# Patient Record
Sex: Male | Born: 1980 | Race: White | Hispanic: No | Marital: Married | Smoking: Never smoker
Health system: Southern US, Community
[De-identification: ages and names within clinical notes are randomized; demographics above are authoritative.]

## PROBLEM LIST (undated history)

## (undated) DIAGNOSIS — I10 Essential (primary) hypertension: Secondary | ICD-10-CM

## (undated) DIAGNOSIS — J45909 Unspecified asthma, uncomplicated: Secondary | ICD-10-CM

## (undated) DIAGNOSIS — T7840XA Allergy, unspecified, initial encounter: Secondary | ICD-10-CM

## (undated) DIAGNOSIS — N50819 Testicular pain, unspecified: Secondary | ICD-10-CM

## (undated) HISTORY — DX: Allergy, unspecified, initial encounter: T78.40XA

## (undated) HISTORY — DX: Testicular pain, unspecified: N50.819

## (undated) HISTORY — DX: Unspecified asthma, uncomplicated: J45.909

---

## 2004-02-05 ENCOUNTER — Emergency Department (HOSPITAL_COMMUNITY): Admission: EM | Admit: 2004-02-05 | Discharge: 2004-02-05 | Payer: Self-pay | Admitting: Family Medicine

## 2005-05-12 HISTORY — PX: PILONIDAL CYST EXCISION: SHX744

## 2005-06-13 ENCOUNTER — Ambulatory Visit (HOSPITAL_COMMUNITY): Admission: RE | Admit: 2005-06-13 | Discharge: 2005-06-13 | Payer: Self-pay | Admitting: Surgery

## 2008-11-28 LAB — LIPID PANEL
Cholesterol: 164 mg/dL (ref 0–200)
HDL: 49 mg/dL (ref 35–70)
LDL Cholesterol: 116 mg/dL
LDl/HDL Ratio: 3.4
Triglycerides: 69 mg/dL (ref 40–160)

## 2008-11-28 LAB — BASIC METABOLIC PANEL
BUN: 12 mg/dL (ref 4–21)
Creatinine: 1.2 mg/dL (ref 0.6–1.3)
GLUCOSE: 75 mg/dL
POTASSIUM: 5.3 mmol/L (ref 3.4–5.3)
Sodium: 144 mmol/L (ref 137–147)

## 2008-11-28 LAB — HEPATIC FUNCTION PANEL
ALT: 24 U/L (ref 10–40)
AST: 28 U/L (ref 14–40)
Bilirubin, Total: 1.8 mg/dL

## 2008-11-29 LAB — CBC AND DIFFERENTIAL
HCT: 45 % (ref 41–53)
Hemoglobin: 15.7 g/dL (ref 13.5–17.5)
PLATELETS: 253 10*3/uL (ref 150–399)
WBC: 7.9 10^3/mL

## 2011-09-19 ENCOUNTER — Other Ambulatory Visit (HOSPITAL_COMMUNITY): Payer: Self-pay | Admitting: Urology

## 2011-09-19 DIAGNOSIS — M545 Low back pain: Secondary | ICD-10-CM

## 2011-10-22 ENCOUNTER — Ambulatory Visit (HOSPITAL_COMMUNITY)
Admission: RE | Admit: 2011-10-22 | Discharge: 2011-10-22 | Disposition: A | Discharge status: Home / Self Care | Payer: BC Managed Care – PPO | Source: Ambulatory Visit | Attending: Urology | Admitting: Urology

## 2011-10-22 ENCOUNTER — Other Ambulatory Visit (HOSPITAL_COMMUNITY): Payer: Self-pay | Admitting: Urology

## 2011-10-22 ENCOUNTER — Inpatient Hospital Stay (HOSPITAL_COMMUNITY): Admission: RE | Admit: 2011-10-22 | Payer: Self-pay | Source: Ambulatory Visit

## 2011-10-22 DIAGNOSIS — M5126 Other intervertebral disc displacement, lumbar region: Secondary | ICD-10-CM | POA: Insufficient documentation

## 2011-10-22 DIAGNOSIS — R209 Unspecified disturbances of skin sensation: Secondary | ICD-10-CM | POA: Insufficient documentation

## 2011-10-22 DIAGNOSIS — M79609 Pain in unspecified limb: Secondary | ICD-10-CM | POA: Insufficient documentation

## 2011-10-22 DIAGNOSIS — R109 Unspecified abdominal pain: Secondary | ICD-10-CM | POA: Insufficient documentation

## 2011-10-22 DIAGNOSIS — M545 Low back pain, unspecified: Secondary | ICD-10-CM | POA: Insufficient documentation

## 2011-10-22 DIAGNOSIS — N509 Disorder of male genital organs, unspecified: Secondary | ICD-10-CM | POA: Insufficient documentation

## 2011-11-17 ENCOUNTER — Ambulatory Visit: Payer: BC Managed Care – PPO | Attending: Diagnostic Neuroimaging

## 2011-11-17 DIAGNOSIS — M545 Low back pain, unspecified: Secondary | ICD-10-CM | POA: Insufficient documentation

## 2011-11-17 DIAGNOSIS — M25569 Pain in unspecified knee: Secondary | ICD-10-CM | POA: Insufficient documentation

## 2011-11-17 DIAGNOSIS — M6281 Muscle weakness (generalized): Secondary | ICD-10-CM | POA: Insufficient documentation

## 2011-11-17 DIAGNOSIS — IMO0001 Reserved for inherently not codable concepts without codable children: Secondary | ICD-10-CM | POA: Insufficient documentation

## 2011-11-21 ENCOUNTER — Ambulatory Visit: Payer: BC Managed Care – PPO | Admitting: Physical Therapy

## 2011-11-25 ENCOUNTER — Ambulatory Visit: Payer: BC Managed Care – PPO

## 2011-11-28 ENCOUNTER — Ambulatory Visit: Payer: BC Managed Care – PPO | Admitting: Physical Therapy

## 2011-12-02 ENCOUNTER — Ambulatory Visit: Payer: BC Managed Care – PPO

## 2011-12-05 ENCOUNTER — Encounter: Payer: BC Managed Care – PPO | Admitting: Physical Therapy

## 2011-12-09 ENCOUNTER — Encounter: Payer: BC Managed Care – PPO | Admitting: Physical Therapy

## 2011-12-12 ENCOUNTER — Encounter: Payer: BC Managed Care – PPO | Admitting: Physical Therapy

## 2014-06-06 ENCOUNTER — Encounter: Payer: Self-pay | Admitting: Family Medicine

## 2014-06-06 DIAGNOSIS — T7840XA Allergy, unspecified, initial encounter: Secondary | ICD-10-CM | POA: Insufficient documentation

## 2014-06-06 DIAGNOSIS — J45909 Unspecified asthma, uncomplicated: Secondary | ICD-10-CM | POA: Insufficient documentation

## 2014-06-16 ENCOUNTER — Encounter: Payer: Self-pay | Admitting: Family Medicine

## 2014-06-16 DIAGNOSIS — J45909 Unspecified asthma, uncomplicated: Secondary | ICD-10-CM

## 2020-07-29 ENCOUNTER — Encounter (HOSPITAL_BASED_OUTPATIENT_CLINIC_OR_DEPARTMENT_OTHER): Payer: Self-pay

## 2020-07-29 ENCOUNTER — Other Ambulatory Visit: Payer: Self-pay

## 2020-07-29 ENCOUNTER — Emergency Department (HOSPITAL_BASED_OUTPATIENT_CLINIC_OR_DEPARTMENT_OTHER)
Admission: EM | Admit: 2020-07-29 | Discharge: 2020-07-29 | Disposition: A | Discharge status: Home / Self Care | Payer: BC Managed Care – PPO | Attending: Emergency Medicine | Admitting: Emergency Medicine

## 2020-07-29 ENCOUNTER — Emergency Department (HOSPITAL_BASED_OUTPATIENT_CLINIC_OR_DEPARTMENT_OTHER): Payer: BC Managed Care – PPO

## 2020-07-29 DIAGNOSIS — R079 Chest pain, unspecified: Secondary | ICD-10-CM

## 2020-07-29 DIAGNOSIS — R0789 Other chest pain: Secondary | ICD-10-CM | POA: Diagnosis not present

## 2020-07-29 DIAGNOSIS — J45909 Unspecified asthma, uncomplicated: Secondary | ICD-10-CM | POA: Insufficient documentation

## 2020-07-29 LAB — BASIC METABOLIC PANEL
Anion gap: 7 (ref 5–15)
BUN: 18 mg/dL (ref 6–20)
CO2: 29 mmol/L (ref 22–32)
Calcium: 8.8 mg/dL — ABNORMAL LOW (ref 8.9–10.3)
Chloride: 105 mmol/L (ref 98–111)
Creatinine, Ser: 1.17 mg/dL (ref 0.61–1.24)
GFR, Estimated: >60 mL/min (ref >60)
Glucose, Bld: 99 mg/dL (ref 70–99)
Potassium: 4 mmol/L (ref 3.5–5.1)
Sodium: 141 mmol/L (ref 135–145)

## 2020-07-29 LAB — CBC
HCT: 42.1 % (ref 39.0–52.0)
Hemoglobin: 14.6 g/dL (ref 13.0–17.0)
MCH: 28.9 pg (ref 26.0–34.0)
MCHC: 34.7 g/dL (ref 30.0–36.0)
MCV: 83.2 fL (ref 80.0–100.0)
Platelets: 208 10*3/uL (ref 150–400)
RBC: 5.06 MIL/uL (ref 4.22–5.81)
RDW: 12.2 % (ref 11.5–15.5)
WBC: 6 10*3/uL (ref 4.0–10.5)
nRBC: 0 % (ref 0.0–0.2)

## 2020-07-29 LAB — TROPONIN I (HIGH SENSITIVITY)
Troponin I (High Sensitivity): 5 ng/L (ref <18)
Troponin I (High Sensitivity): 5 ng/L (ref <18)

## 2020-07-29 MED ORDER — LIDOCAINE VISCOUS HCL 2 % MT SOLN
15.0000 mL | Freq: Once | OROMUCOSAL | Status: AC
  Administered 2020-07-29: 15 mL via ORAL
  Filled 2020-07-29: qty 15

## 2020-07-29 MED ORDER — ALUM & MAG HYDROXIDE-SIMETH 200-200-20 MG/5ML PO SUSP
30.0000 mL | Freq: Once | ORAL | Status: AC
  Administered 2020-07-29: 30 mL via ORAL
  Filled 2020-07-29: qty 30

## 2020-07-29 NOTE — ED Notes (Signed)
Patient reports since high school he has had difficulty swallowing after certain foods. Patient reports he has never seen a GI specialist or had testing done for Alpha Gal or EoE. Patient reports bread and steak are noticeably harder to swallow. MD made aware of patient's reported symptoms. Patient denies being on an H2 or PPI for GERD symptoms.

## 2020-07-29 NOTE — ED Triage Notes (Signed)
He c/o persistent central chest discomfort since Fri. (two days). He is healthy and athletic in appearance. He also tells me he has a hx of asthma "and I've felt chest tightness from asthma, but this is different". His skin is normal, warm and dry and he is breathing normally. EK performed.

## 2020-07-29 NOTE — ED Provider Notes (Signed)
[facility] EMERGENCY DEPT Provider Note   CSN: [id number] Arrival date & time: 07/29/20  0815     History Chief Complaint  Patient presents with  . Chest Pain    Ronald Lynch is a 40 y.o. male.  The patient also walks 1 hour/day.  He states he came in because the pain has been present for 3 days.  It has been intermittent, but the last episode began last night.  There are no exacerbating or relieving factors.   Chest Pain Associated symptoms: no abdominal pain, no back pain, no cough, no fever, no palpitations, no shortness of breath and no vomiting     HPI: A 40 year old patient presents for evaluation of chest pain. Initial onset of pain was more than 6 hours ago. The patient's chest pain is described as heaviness/pressure/tightness and is not worse with exertion. The patient's chest pain is middle- or left-sided, is not well-localized, is not sharp and does not radiate to the arms/jaw/neck. The patient does not complain of nausea and denies diaphoresis. The patient has no history of stroke, has no history of peripheral artery disease, has not smoked in the past 90 days, denies any history of treated diabetes, has no relevant family history of coronary artery disease (first degree relative at less than age 29), is not hypertensive, has no history of hypercholesterolemia and does not have an elevated BMI (>=30).   Past Medical History:  Diagnosis Date  . Allergy   . Asthma   . Testicular pain     Patient Active Problem List   Diagnosis Date Noted  . Allergy   . Asthma     Past Surgical History:  Procedure Laterality Date  . PILONIDAL CYST EXCISION  2007       Family History  Problem Relation Age of Onset  . Crohn's disease Father   . Asthma Brother        childhood asthma    Social History   Tobacco Use  . Smoking status: Never Smoker  . Smokeless tobacco: Never Used  Substance Use Topics  . Alcohol use: Yes    Alcohol/week: 2.0 standard  drinks    Types: 2 Cans of beer per week  . Drug use: No    Home Medications Prior to Admission medications   Medication Sig Start Date End Date Taking? Authorizing Provider  albuterol (PROVENTIL HFA;VENTOLIN HFA) 108 (90 BASE) MCG/ACT inhaler Inhale 2 puffs into the lungs every 6 (six) hours as needed for wheezing or shortness of breath.    [provider]  loratadine (CLARITIN) 10 MG tablet Take 10 mg by mouth daily.    [provider]    Allergies    Patient has no known allergies.  Review of Systems   Review of Systems  Constitutional: Negative for chills and fever.  HENT: Negative for ear pain and sore throat.   Eyes: Negative for pain and visual disturbance.  Respiratory: Negative for cough and shortness of breath.   Cardiovascular: Positive for chest pain. Negative for palpitations.  Gastrointestinal: Negative for abdominal pain and vomiting.  Genitourinary: Negative for dysuria and hematuria.  Musculoskeletal: Negative for arthralgias and back pain.  Skin: Negative for color change and rash.  Neurological: Negative for seizures and syncope.  All other systems reviewed and are negative.   Physical Exam Updated Vital Signs BP (!) 146/95 (BP Location: Right Arm)   Pulse 62   Temp 98 F (36.7 C) (Oral)   Resp 13   Ht 5'  8" (1.727 m)   Wt 77.1 kg   SpO2 100%   BMI 25.85 kg/m   Physical Exam Vitals and nursing note reviewed.  Constitutional:      Appearance: He is well-developed.  HENT:     Head: Normocephalic and atraumatic.  Eyes:     Conjunctiva/sclera: Conjunctivae normal.  Cardiovascular:     Rate and Rhythm: Normal rate and regular rhythm.     Heart sounds: No murmur heard.   Pulmonary:     Effort: Pulmonary effort is normal. No respiratory distress.     Breath sounds: Normal breath sounds.  Musculoskeletal:     Cervical back: Neck supple.  Skin:    General: Skin is warm and dry.  Neurological:     General: No focal deficit  present.     Mental Status: He is alert.  Psychiatric:        Mood and Affect: Mood normal.     ED Results / Procedures / Treatments   Labs (all labs ordered are listed, but only abnormal results are displayed) Labs Reviewed  BASIC METABOLIC PANEL - Abnormal; Notable for the following components:      Result Value   Calcium 8.8 (*)    All other components within normal limits  CBC  TROPONIN I (HIGH SENSITIVITY)    EKG None Normal axis, no ischemia Radiology DG Chest 2 View  Result Date: 07/29/2020 CLINICAL DATA:  Chest pain since Friday. EXAM: CHEST - 2 VIEW COMPARISON:  None. FINDINGS: The heart size and mediastinal contours are within normal limits. Both lungs are clear. The visualized skeletal structures are unremarkable. IMPRESSION: No active cardiopulmonary disease. Electronically Signed   By: Sherian Rein M.D.   On: 07/29/2020 08:53    Procedures Procedures   Medications Ordered in ED Medications  alum & mag hydroxide-simeth (MAALOX/MYLANTA) 200-200-20 MG/5ML suspension 30 mL (30 mLs Oral Given 07/29/20 0951)    And  lidocaine (XYLOCAINE) 2 % viscous mouth solution 15 mL (15 mLs Oral Given 07/29/20 1914)    ED Course  I have reviewed the triage vital signs and the nursing notes.  Pertinent labs & imaging results that were available during my care of the patient were reviewed by me and considered in my medical decision making (see chart for details).    MDM Rules/Calculators/A&P HEAR Score: 1                        The patient is 40 years old and well-appearing.  He has no cardiac risk factors.  He presents with left-sided chest pain.  He was evaluated for possible ACS, pneumonia.  He is PERC negative for PE, and I did not work this diagnosis up any further.  Based on his history, it seems that he might have a GI source of pain, and he was referred to GI for further evaluation. Final Clinical Impression(s) / ED Diagnoses Final diagnoses:  Chest pain, unspecified  type    Rx / DC Orders ED Discharge Orders    None       Koleen Distance, MD 07/29/20 1110

## 2021-11-25 ENCOUNTER — Emergency Department (HOSPITAL_BASED_OUTPATIENT_CLINIC_OR_DEPARTMENT_OTHER): Payer: BC Managed Care – PPO | Admitting: Radiology

## 2021-11-25 ENCOUNTER — Emergency Department (HOSPITAL_BASED_OUTPATIENT_CLINIC_OR_DEPARTMENT_OTHER)
Admission: EM | Admit: 2021-11-25 | Discharge: 2021-11-25 | Disposition: A | Discharge status: Home / Self Care | Payer: BC Managed Care – PPO | Attending: Emergency Medicine | Admitting: Emergency Medicine

## 2021-11-25 ENCOUNTER — Other Ambulatory Visit: Payer: Self-pay

## 2021-11-25 ENCOUNTER — Other Ambulatory Visit (HOSPITAL_BASED_OUTPATIENT_CLINIC_OR_DEPARTMENT_OTHER): Payer: Self-pay

## 2021-11-25 ENCOUNTER — Encounter (HOSPITAL_BASED_OUTPATIENT_CLINIC_OR_DEPARTMENT_OTHER): Payer: Self-pay

## 2021-11-25 ENCOUNTER — Emergency Department (HOSPITAL_BASED_OUTPATIENT_CLINIC_OR_DEPARTMENT_OTHER): Payer: BC Managed Care – PPO

## 2021-11-25 DIAGNOSIS — Z20822 Contact with and (suspected) exposure to covid-19: Secondary | ICD-10-CM | POA: Insufficient documentation

## 2021-11-25 DIAGNOSIS — R202 Paresthesia of skin: Secondary | ICD-10-CM | POA: Diagnosis not present

## 2021-11-25 DIAGNOSIS — R079 Chest pain, unspecified: Secondary | ICD-10-CM

## 2021-11-25 DIAGNOSIS — R6883 Chills (without fever): Secondary | ICD-10-CM | POA: Insufficient documentation

## 2021-11-25 DIAGNOSIS — R0602 Shortness of breath: Secondary | ICD-10-CM | POA: Diagnosis not present

## 2021-11-25 DIAGNOSIS — R200 Anesthesia of skin: Secondary | ICD-10-CM

## 2021-11-25 HISTORY — DX: Essential (primary) hypertension: I10

## 2021-11-25 LAB — BASIC METABOLIC PANEL
Anion gap: 9 (ref 5–15)
BUN: 25 mg/dL — ABNORMAL HIGH (ref 6–20)
CO2: 26 mmol/L (ref 22–32)
Calcium: 9.7 mg/dL (ref 8.9–10.3)
Chloride: 100 mmol/L (ref 98–111)
Creatinine, Ser: 1.14 mg/dL (ref 0.61–1.24)
GFR, Estimated: >60 mL/min (ref >60)
Glucose, Bld: 96 mg/dL (ref 70–99)
Potassium: 3.6 mmol/L (ref 3.5–5.1)
Sodium: 135 mmol/L (ref 135–145)

## 2021-11-25 LAB — CBC
HCT: 41 % (ref 39.0–52.0)
Hemoglobin: 14.5 g/dL (ref 13.0–17.0)
MCH: 29.7 pg (ref 26.0–34.0)
MCHC: 35.4 g/dL (ref 30.0–36.0)
MCV: 84 fL (ref 80.0–100.0)
Platelets: 241 10*3/uL (ref 150–400)
RBC: 4.88 MIL/uL (ref 4.22–5.81)
RDW: 12.2 % (ref 11.5–15.5)
WBC: 6.6 10*3/uL (ref 4.0–10.5)
nRBC: 0 % (ref 0.0–0.2)

## 2021-11-25 LAB — TROPONIN I (HIGH SENSITIVITY)
Troponin I (High Sensitivity): 8 ng/L (ref <18)
Troponin I (High Sensitivity): 6 ng/L (ref <18)

## 2021-11-25 LAB — D-DIMER, QUANTITATIVE: D-Dimer, Quant: <0.27 ug/mL-FEU (ref 0.00–0.50)

## 2021-11-25 LAB — SARS CORONAVIRUS 2 BY RT PCR: SARS Coronavirus 2 by RT PCR: NEGATIVE

## 2021-11-25 MED ORDER — FAMOTIDINE 20 MG PO TABS: 20.0000 mg | ORAL_TABLET | Freq: Two times a day (BID) | ORAL | 0 refills | Status: AC

## 2021-11-25 MED ORDER — PANTOPRAZOLE SODIUM 40 MG PO TBEC
40.0000 mg | DELAYED_RELEASE_TABLET | Freq: Every day | ORAL | 0 refills | Status: DC
  Filled 2021-11-25: qty 30, 30d supply, fill #0

## 2021-11-25 MED ORDER — FAMOTIDINE 20 MG PO TABS
20.0000 mg | ORAL_TABLET | Freq: Two times a day (BID) | ORAL | 0 refills | Status: DC
  Filled 2021-11-25: qty 30, 15d supply, fill #0

## 2021-11-25 MED ORDER — PANTOPRAZOLE SODIUM 40 MG PO TBEC
40.0000 mg | DELAYED_RELEASE_TABLET | Freq: Every day | ORAL | 0 refills | Status: DC
Start: 2021-11-25 — End: 2021-12-25

## 2021-11-25 MED ORDER — LIDOCAINE VISCOUS HCL 2 % MT SOLN
15.0000 mL | Freq: Once | OROMUCOSAL | Status: AC
  Administered 2021-11-25: 15 mL via ORAL
  Filled 2021-11-25: qty 15

## 2021-11-25 MED ORDER — ALUM & MAG HYDROXIDE-SIMETH 200-200-20 MG/5ML PO SUSP
30.0000 mL | Freq: Once | ORAL | Status: AC
  Administered 2021-11-25: 30 mL via ORAL
  Filled 2021-11-25: qty 30

## 2021-11-25 NOTE — ED Provider Notes (Signed)
[facility] EMERGENCY DEPT Provider Note   CSN: [id number] Arrival date & time: 11/25/21  1336     History  Chief Complaint  Patient presents with   Chest Pain    Ronald Lynch is a 41 y.o. male.   Chest Pain    41 year old male presenting to the ED with right sided chest pain, numbness fown the right arm, and chills. Symptoms started this morning. Has previously had chest pain associated with what was thought to be GERD, usually resolves after taking a natural remedy for heartburn. He endorses persistent right sided chest discomfort, no aggravating or alleviating factors.  He endorses numbness down his right arm that has improved somewhat but persists.  He denies any radiation of the chest discomfort.  He endorses mild shortness of breath, denies a cough, denies a fever.  Endorsed chills earlier which is since resolved.  Denies any facial droop, facial numbness or weakness, weakness in the extremities.  He denies any abdominal pain, nausea, vomiting, diaphoresis. He denies any rash to his chest wall.  Home Medications Prior to Admission medications   Medication Sig Start Date End Date Taking? Authorizing Provider  famotidine (PEPCID) 20 MG tablet Take 1 tablet (20 mg total) by mouth 2 (two) times daily. 11/25/21  Yes Ernie Avena, MD  pantoprazole (PROTONIX) 40 MG tablet Take 1 tablet (40 mg total) by mouth daily. 11/25/21 12/25/21 Yes Ernie Avena, MD  albuterol (PROVENTIL HFA;VENTOLIN HFA) 108 (90 BASE) MCG/ACT inhaler Inhale 2 puffs into the lungs every 6 (six) hours as needed for wheezing or shortness of breath.    [provider]  loratadine (CLARITIN) 10 MG tablet Take 10 mg by mouth daily.    [provider]      Allergies    Patient has no known allergies.    Review of Systems   Review of Systems  Cardiovascular:  Positive for chest pain.  All other systems reviewed and are negative.   Physical Exam Updated Vital Signs BP 138/88    Pulse 64   Temp 97.8 F (36.6 C)   Resp 17   Ht 5\' 8"  (1.727 m)   Wt 77.1 kg   SpO2 100%   BMI 25.84 kg/m  Physical Exam Vitals and nursing note reviewed.  Constitutional:      General: He is not in acute distress.    Appearance: He is well-developed.  HENT:     Head: Normocephalic and atraumatic.  Eyes:     Conjunctiva/sclera: Conjunctivae normal.  Cardiovascular:     Rate and Rhythm: Normal rate and regular rhythm.     Pulses: Normal pulses.     Heart sounds: Normal heart sounds.  Pulmonary:     Effort: Pulmonary effort is normal. No respiratory distress.     Breath sounds: Normal breath sounds. No wheezing, rhonchi or rales.  Chest:     Comments: No rash, no chest wall tenderness. Abdominal:     Palpations: Abdomen is soft.     Tenderness: There is no abdominal tenderness.  Musculoskeletal:        General: No swelling.     Cervical back: Neck supple.     Right lower leg: No edema.     Left lower leg: No edema.  Skin:    General: Skin is warm and dry.     Capillary Refill: Capillary refill takes less than 2 seconds.  Neurological:     General: No focal deficit present.     Mental Status:  He is alert and oriented to person, place, and time. Mental status is at baseline.     Comments: MENTAL STATUS EXAM:    Orientation: Alert and oriented to person, place and time.  Memory: Cooperative, follows commands well.  Language: Speech is clear and language is normal.   CRANIAL NERVES:    CN 2 (Optic): Visual fields intact to confrontation.  CN 3,4,6 (EOM): Pupils equal and reactive to light. Full extraocular eye movement without nystagmus.  CN 5 (Trigeminal): Facial sensation is normal, no weakness of masticatory muscles.  CN 7 (Facial): No facial weakness or asymmetry.  CN 8 (Auditory): Auditory acuity grossly normal.  CN 9,10 (Glossophar): The uvula is midline, the palate elevates symmetrically.  CN 11 (spinal access): Normal sternocleidomastoid and trapezius strength.   CN 12 (Hypoglossal): The tongue is midline. No atrophy or fasciculations.Marland Kitchen   MOTOR:  Muscle Strength: 5/5RUE, 5/5LUE, 5/5RLE, 5/5LLE.   COORDINATION:   Intact finger-to-nose, no tremor, no pronator drift.   SENSATION:   Intact to light touch all four extremities. No sensory deficit.     Psychiatric:        Mood and Affect: Mood normal.     ED Results / Procedures / Treatments   Labs (all labs ordered are listed, but only abnormal results are displayed) Labs Reviewed  BASIC METABOLIC PANEL - Abnormal; Notable for the following components:      Result Value   BUN 25 (*)    All other components within normal limits  SARS CORONAVIRUS 2 BY RT PCR  CBC  D-DIMER, QUANTITATIVE  TROPONIN I (HIGH SENSITIVITY)  TROPONIN I (HIGH SENSITIVITY)    EKG EKG Interpretation  Date/Time:  Monday November 25 2021 13:53:09 EDT Ventricular Rate:  64 PR Interval:  130 QRS Duration: 96 QT Interval:  414 QTC Calculation: 427 R Axis:   76 Text Interpretation: Normal sinus rhythm Normal ECG When compared with ECG of 29-Jul-2020 08:24, PREVIOUS ECG IS PRESENT Confirmed by Alona Bene [phone]) on 11/25/2021 1:58:54 PM  Radiology CT Head Wo Contrast  Result Date: 11/25/2021 CLINICAL DATA:  Right arm numbness, TIA EXAM: CT HEAD WITHOUT CONTRAST TECHNIQUE: Contiguous axial images were obtained from the base of the skull through the vertex without intravenous contrast. RADIATION DOSE REDUCTION: This exam was performed according to the departmental dose-optimization program which includes automated exposure control, adjustment of the mA and/or kV according to patient size and/or use of iterative reconstruction technique. COMPARISON:  None Available. FINDINGS: Brain: No acute intracranial findings are seen a noncontrast CT brain. There are no signs of bleeding within the cranium. Ventricles are not dilated. There is no focal edema or mass effect. Vascular: Unremarkable. Skull: No fracture is seen in the skull.  Sinuses/Orbits: There is mucosal thickening in ethmoid sinus. Other: None. IMPRESSION: No acute intracranial findings are seen in noncontrast CT brain. Electronically Signed   By: Ernie Avena M.D.   On: 11/25/2021 15:36   DG Chest 2 View  Result Date: 11/25/2021 CLINICAL DATA:  Chest pain EXAM: CHEST - 2 VIEW COMPARISON:  07/29/2020 FINDINGS: The heart size and mediastinal contours are within normal limits. Both lungs are clear. No pleural effusion or pneumothorax. The visualized skeletal structures are unremarkable. IMPRESSION: No active cardiopulmonary disease. Electronically Signed   By: Guadlupe Spanish M.D.   On: 11/25/2021 14:16    Procedures Procedures    Medications Ordered in ED Medications  alum & mag hydroxide-simeth (MAALOX/MYLANTA) 200-200-20 MG/5ML suspension 30 mL (30 mLs Oral Given 11/25/21 1536)  And  lidocaine (XYLOCAINE) 2 % viscous mouth solution 15 mL (15 mLs Oral Given 11/25/21 1536)    ED Course/ Medical Decision Making/ A&P                           Medical Decision Making Amount and/or Complexity of Data Reviewed Labs: ordered. Radiology: ordered.  Risk OTC drugs. Prescription drug management.     41 year old male presenting to the ED with right sided chest pain, numbness fown the right arm, and chills. Symptoms started this morning. Has previously had chest pain associated with what was thought to be GERD, usually resolves after taking a natural remedy for heartburn. He endorses persistent right sided chest discomfort, no aggravating or alleviating factors.  He endorses numbness down his right arm that has improved somewhat but persists.  He denies any radiation of the chest discomfort.  He endorses mild shortness of breath, denies a cough, denies a fever.  Endorsed chills earlier which is since resolved.  Denies any facial droop, facial numbness or weakness, weakness in the extremities.  He denies any abdominal pain, nausea, vomiting, diaphoresis. He  denies any rash to his chest wall.  Vitals and telemetry on arrival: Afebrile, hemodynamically stable, not tachycardic or tachypneic, mildly hypertensive BP 147/103, saturating 100% on room air  Pertinent exam findings include: Normal neurologic exam to include no sensory deficit to light touch, 5 out of 5 strength in all extremities with no cranial nerve deficit, 2+ radial pulses bilaterally with no pulse deficit, lungs clear to auscultation bilaterally, no murmurs rubs or gallops heard on cardiac exam.  No chest wall tenderness or rash noted.  Differential diagnosis includes: Anxiety, aortic dissection, ACS, pneumonia, pneumothorax, pulmonary embolism,pericarditis/myocarditis, GERD, PUD, musculoskeletal, TIA/CVA, COVID 19.  EKG: Normal sinus rhythm with a rate of 64 and no evidence of acute ischemic changes, abnormal intervals, or dysrhythmia. No concerning change from prior  Lab results include:Troponins x2 negative, dimer negative, CBC and BMP generally unremarkable. COVID PCR negative.   PERC positive due to recent long car ride. Well's score, low probability. D-dimer negative. Labs unremarkable.  Unlikely pneumonia, no cough, no leukocytosis, no fevers, CXR and exam without acute findings. Unlikely pneumothorax, no findings on  CXR. Unlikely pericarditis/myocarditis, does not fit clinical picture. Chest pain not exertional. Unlikely dissection, no pulse deficit, no tearing chest pain.   Imaging results include:CXR reviewed and interpreted by myself in addition to radiology negative for airspace disease.  CT head without acute intracranial abnormality.  Course of tx has consisted of: Maalox and viscous lidocaine  Patient presenting with right-sided chest discomfort.  Lower concern for aortic dissection, right-sided chest discomfort present with no radiation to the back, no ripping or tearing componen, no pulse deficit.  Symptoms resolved after administration of Maalox and viscous lidocaine,  likely consistent with GERD or peptic ulcer disease.  No right upper quadrant tenderness to palpation on exam to suggest cholelithiasis/cholecystitis.  Discussed with the patient the possibility that his arm numbness could be due to TIA/CVA with a small stroke affecting sensation to his right upper extremity. CT Head negative for intracranial abnormality. MRI imaging currently not available at this emergency department.  Patient does not want to transfer to another emergency department for MRI at this time. Discussed with him the lack of ability to rule out acute stroke or perform full TIA workup. Due to this, will defer neurologic consultation and have the patient follow-up with neurology and his  PCP outpatient. Referrals placed for outpatient neurology and gastroenterology follow-up.  Final Clinical Impression(s) / ED Diagnoses Final diagnoses:  Right-sided chest pain  Right arm numbness    Rx / DC Orders ED Discharge Orders          Ordered    Ambulatory referral to Neurology       Comments: An appointment is requested in approximately: 4 weeks   11/25/21 1742    pantoprazole (PROTONIX) 40 MG tablet  Daily        11/25/21 1742    famotidine (PEPCID) 20 MG tablet  2 times daily        11/25/21 1742    Ambulatory referral to Gastroenterology        11/25/21 1742              Ernie Avena, MD 11/25/21 1744

## 2021-11-25 NOTE — ED Triage Notes (Signed)
Patient here POV from Home.  Endorses Right Sided CP that began this AM. Right Arm became Numb approximately 1 Hour ago which has since become slightly better. En Route the Patient also became SOB.   No N/V/D. No Known Fevers.   Thought it was GERD but Medication was Ineffective.   NAD Noted during Triage. A&Ox4. GCS 15. Ambulatory.

## 2021-11-25 NOTE — Discharge Instructions (Addendum)
Your cardiac enzymes were normal.  Your EKG and chest x-ray were reassuring.  The remainder of your lab work was also reassuring.  We offered transfer for MRI imaging to evaluate for possible stroke given the right arm numbness which he declined.  Recommend follow-up outpatient with neurology to discuss the episode of arm numbness.  A CT head was performed which revealed no acute intracranial abnormality, however an MRI is needed to fully rule out stroke.  Regarding your chest pain, your work-up has been reassuring.  Symptoms could be due to GERD or a peptic ulcer.  We will start you on medication for treatment. Will provide referral to gastroenterology for further outpatient workup. Follow-up with your PCP to coordinate care.

## 2021-11-26 ENCOUNTER — Encounter: Payer: Self-pay | Admitting: Physician Assistant

## 2021-12-25 ENCOUNTER — Ambulatory Visit: Payer: BC Managed Care – PPO | Admitting: Neurology

## 2021-12-25 ENCOUNTER — Ambulatory Visit: Payer: BC Managed Care – PPO | Admitting: Physician Assistant

## 2021-12-25 ENCOUNTER — Encounter: Payer: Self-pay | Admitting: Physician Assistant

## 2021-12-25 VITALS — BP 122/86 | HR 84 | Ht 68.0 in | Wt 173.4 lb

## 2021-12-25 DIAGNOSIS — K219 Gastro-esophageal reflux disease without esophagitis: Secondary | ICD-10-CM | POA: Diagnosis not present

## 2021-12-25 DIAGNOSIS — R0789 Other chest pain: Secondary | ICD-10-CM | POA: Diagnosis not present

## 2021-12-25 MED ORDER — PANTOPRAZOLE SODIUM 40 MG PO TBEC: 40.0000 mg | DELAYED_RELEASE_TABLET | Freq: Every day | ORAL | 4 refills | Status: DC

## 2021-12-25 NOTE — Patient Instructions (Addendum)
We have sent the following medications to your pharmacy for you to pick up at your convenience: Pantoprazole 40 mg daily 30-60 minutes before breakfast.   _______________________________________________________  If you are age 42 or older, your body mass index should be between 23-30. Your Body mass index is 26.37 kg/m. If this is out of the aforementioned range listed, please consider follow up with your Primary Care Provider.  If you are age 18 or younger, your body mass index should be between 19-25. Your Body mass index is 26.37 kg/m. If this is out of the aformentioned range listed, please consider follow up with your Primary Care Provider.   ________________________________________________________  The [facility] providers would like to encourage you to use [facility] to communicate with providers for non-urgent requests or questions.  Due to long hold times on the telephone, sending your provider a message by [facility] may be a faster and more efficient way to get a response.  Please allow 48 business hours for a response.  Please remember that this is for non-urgent requests.  _______________________________________________________

## 2021-12-25 NOTE — Progress Notes (Signed)
Chief Complaint: GERD and atypical chest pain  HPI:    Mr. Ronald Lynch is a 41 year old male with a past medical history as listed below, who was referred to me by Lindaann Pascal, PA-C for a complaint of GERD and atypical chest pain.      11/25/2021 patient seen in the ED for right-sided chest pain and numbness down his right arm as well as chills.  Labs including CBC, BMP and troponins were normal.  EKG normal.  CT head showed no acute findings.  Chest x-ray was normal.  It was thought these were likely resulting from reflux.  He was started on Pantoprazole 40 mg daily and Pepcid 20 mg twice daily.    Today, the patient presents to clinic and tells me that ever since he turned 39 he has had trouble with reflux and some chest pain associated with this.  He typically tried at home remedies which seemed to work well but he has been to the ER on 2 occasions for chest pain recently which they have diagnosed as reflux.  He was just now started on Pantoprazole 40 mg once a day and is taking this in the morning over the past month or so and was also given Famotidine 20 mg as needed.  He has not had to use the Famotidine on a couple of occasions since starting the Pantoprazole.  For instance last night when he ate tacos but it does help when he takes his medicine.  Also describes that in his younger days he used to have some trouble with feeling his food go down his throat and feeling like it was a little tight, but it never got stuck.  This is worse with big bites of food like subs which she has stopped eating and he does not have this problem as frequently.    Denies any fever, chills, weight loss or abdominal pain.  Past Medical History:  Diagnosis Date   Allergy    Asthma    Hypertension    Testicular pain     Past Surgical History:  Procedure Laterality Date   PILONIDAL CYST EXCISION  2007    Current Outpatient Medications  Medication Sig Dispense Refill   albuterol (PROVENTIL HFA;VENTOLIN HFA) 108  (90 BASE) MCG/ACT inhaler Inhale 2 puffs into the lungs every 6 (six) hours as needed for wheezing or shortness of breath.     amLODipine (NORVASC) 2.5 MG tablet Take 2.5 mg by mouth daily.     loratadine (CLARITIN) 10 MG tablet Take 10 mg by mouth daily.     pantoprazole (PROTONIX) 40 MG tablet Take 1 tablet (40 mg total) by mouth daily. 30 tablet 0   famotidine (PEPCID) 20 MG tablet Take 1 tablet (20 mg total) by mouth 2 (two) times daily. 30 tablet 0   No current facility-administered medications for this visit.    Allergies as of 12/25/2021   (No Known Allergies)    Family History  Problem Relation Age of Onset   Rheum arthritis Mother    Crohn's disease Father    Multiple sclerosis Sister    Ovarian cancer Brother    Asthma Brother        childhood asthma    Social History   Socioeconomic History   Marital status: Married    Spouse name: Not on file   Number of children: 1   Years of education: Not on file   Highest education level: Bachelor's degree (e.g., BA, AB, BS)  Occupational History  Not on file  Tobacco Use   Smoking status: Never   Smokeless tobacco: Never  Vaping Use   Vaping Use: Never used  Substance and Sexual Activity   Alcohol use: Yes    Alcohol/week: 4.0 standard drinks of alcohol    Types: 4 Cans of beer per week    Comment: social drinker   Drug use: No   Sexual activity: Yes  Other Topics Concern   Not on file  Social History Narrative   1 daughter - 15 years old   Social Determinants of Corporate investment banker Strain: Not on file  Food Insecurity: Not on file  Transportation Needs: Not on file  Physical Activity: Not on file  Stress: Not on file  Social Connections: Not on file  Intimate Partner Violence: Not on file    Review of Systems:    Constitutional: No weight loss, fever or chills Skin: No rash  Cardiovascular: No chest pain Respiratory: No SOB  Gastrointestinal: See HPI and otherwise negative Genitourinary: No  dysuria Neurological: No headache, dizziness or syncope Musculoskeletal: No new muscle or joint pain Hematologic: No bleeding  Psychiatric: No history of depression or anxiety   Physical Exam:  Vital signs: BP 122/86 (BP Location: Left Arm, Patient Position: Sitting)   Pulse 84   Ht 5\' 8"  (1.727 m)   Wt 173 lb 6.4 oz (78.7 kg)   SpO2 99%   BMI 26.37 kg/m   Constitutional:   Pleasant Caucasian male appears to be in NAD, Well developed, Well nourished, alert and cooperative Head:  Normocephalic and atraumatic. Eyes:   PEERL, EOMI. No icterus. Conjunctiva pink. Ears:  Normal auditory acuity. Neck:  Supple Throat: Oral cavity and pharynx without inflammation, swelling or lesion.  Respiratory: Respirations even and unlabored. Lungs clear to auscultation bilaterally.   No wheezes, crackles, or rhonchi.  Cardiovascular: Normal S1, S2. No MRG. Regular rate and rhythm. No peripheral edema, cyanosis or pallor.  Gastrointestinal:  Soft, nondistended, nontender. No rebound or guarding. Normal bowel sounds. No appreciable masses or hepatomegaly. Rectal:  Not performed.  Msk:  Symmetrical without gross deformities. Without edema, no deformity or joint abnormality.  Neurologic:  Alert and  oriented x4;  grossly normal neurologically.  Skin:   Dry and intact without significant lesions or rashes. Psychiatric: Demonstrates good judgement and reason without abnormal affect or behaviors.  RELEVANT LABS AND IMAGING: CBC    Component Value Date/Time   WBC 6.6 11/25/2021 1401   RBC 4.88 11/25/2021 1401   HGB 14.5 11/25/2021 1401   HCT 41.0 11/25/2021 1401   PLT 241 11/25/2021 1401   MCV 84.0 11/25/2021 1401   MCH 29.7 11/25/2021 1401   MCHC 35.4 11/25/2021 1401   RDW 12.2 11/25/2021 1401    CMP     Component Value Date/Time   NA 135 11/25/2021 1401   NA 144 11/28/2008 0000   K 3.6 11/25/2021 1401   CL 100 11/25/2021 1401   CO2 26 11/25/2021 1401   GLUCOSE 96 11/25/2021 1401   BUN 25  (H) 11/25/2021 1401   BUN 12 11/28/2008 0000   CREATININE 1.14 11/25/2021 1401   CALCIUM 9.7 11/25/2021 1401   AST 28 11/28/2008 0000   ALT 24 11/28/2008 0000   GFRNONAA >60 11/25/2021 1401    Assessment: 1.  GERD: Over the past year or so episodes of reflux, now over the past month better with Pantoprazole 40 mg once daily and Pepcid 20 mg as needed; likely reactive gastritis +/-  PUD +/- H. pylori 2.  Atypical chest pain: Patient has been to the ER on a couple of occasions and cardiac work-up negative, symptoms are relieved with reflux measures as above  Plan: 1.  At this time patient would like to try staying on the Pantoprazole 40 mg every morning for the next 3 months and see how he does.  Refilled Pantoprazole 40 mg every morning, 30-60 minutes before breakfast, #30 with 3 refills. 2.  Patient can continue Famotidine 20 mg as needed for breakthrough symptoms.  He has enough of this medication at the moment but he can get refills if needed. 3.  Discussed antireflux diet and lifestyle modifications. 4.  Patient will follow in clinic with me in 3 months.  At that time if he continues with symptoms would recommend an EGD.  He was assigned to Dr. Myrtie Neither this morning.  Hyacinth Meeker, PA-C [facility] 12/25/2021, 8:30 AM  Cc: Lindaann Pascal, PA-C

## 2021-12-25 NOTE — Progress Notes (Signed)
____________________________________________________________  Attending physician addendum:  Thank you for sending this case to me. I have reviewed the entire note and agree with the plan.  My advice is that in the 2 weeks leading up to his next appointment with you, that he attempt to wean off the pantoprazole by cutting it down to every other day for a week, and then stop it and switch to famotidine every other day. If he is unable to wean off the PPI, then he needs an upper endoscopy.  Amada Jupiter, MD  ____________________________________________________________

## 2022-05-20 ENCOUNTER — Encounter: Payer: Self-pay | Admitting: Physician Assistant

## 2022-05-24 ENCOUNTER — Other Ambulatory Visit: Payer: Self-pay | Admitting: Physician Assistant

## 2022-08-20 ENCOUNTER — Other Ambulatory Visit: Payer: Self-pay | Admitting: Physician Assistant

## 2022-09-16 IMAGING — DX DG CHEST 2V
2 series · 2 of 2 positions shown · non-contrast
Comparison: None.

CLINICAL DATA: Chest pain since [REDACTED].

EXAM:
CHEST - 2 VIEW

[chest pa]
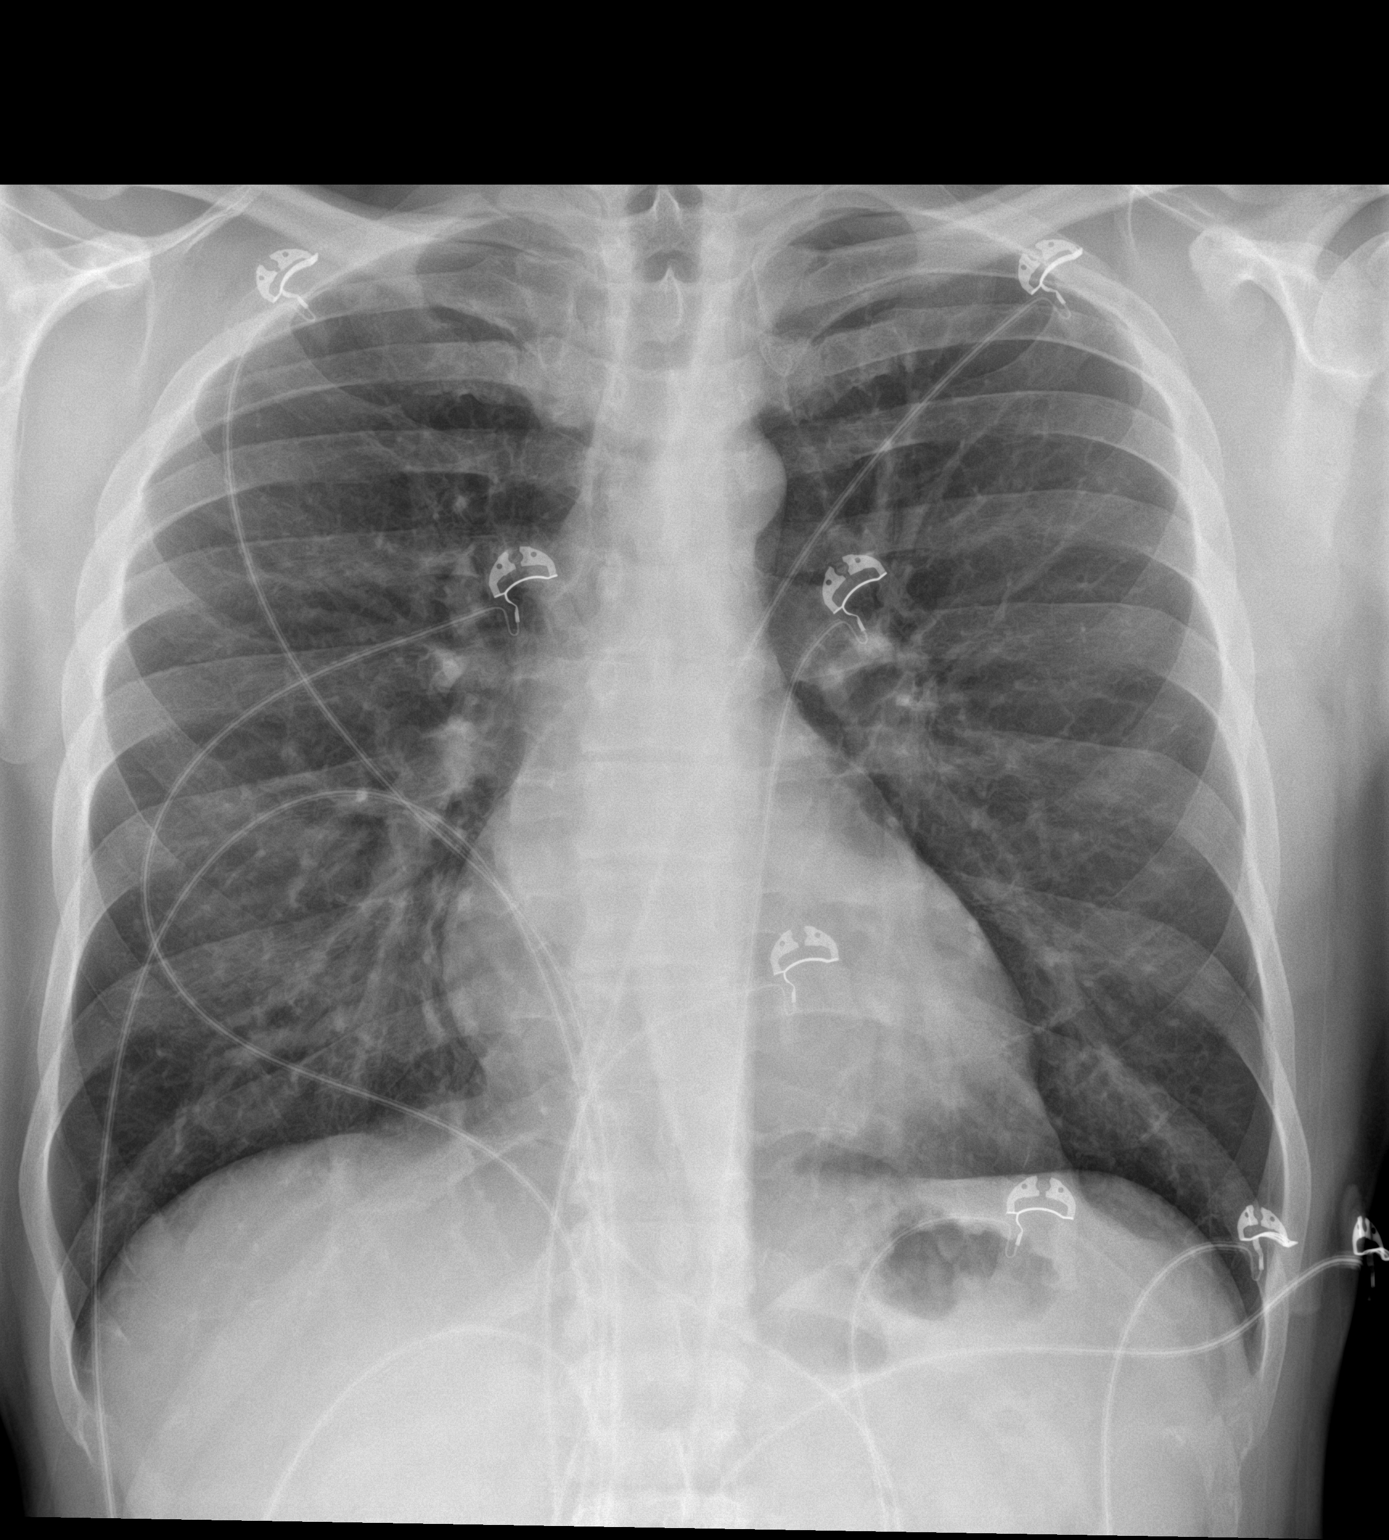

[chest lat]
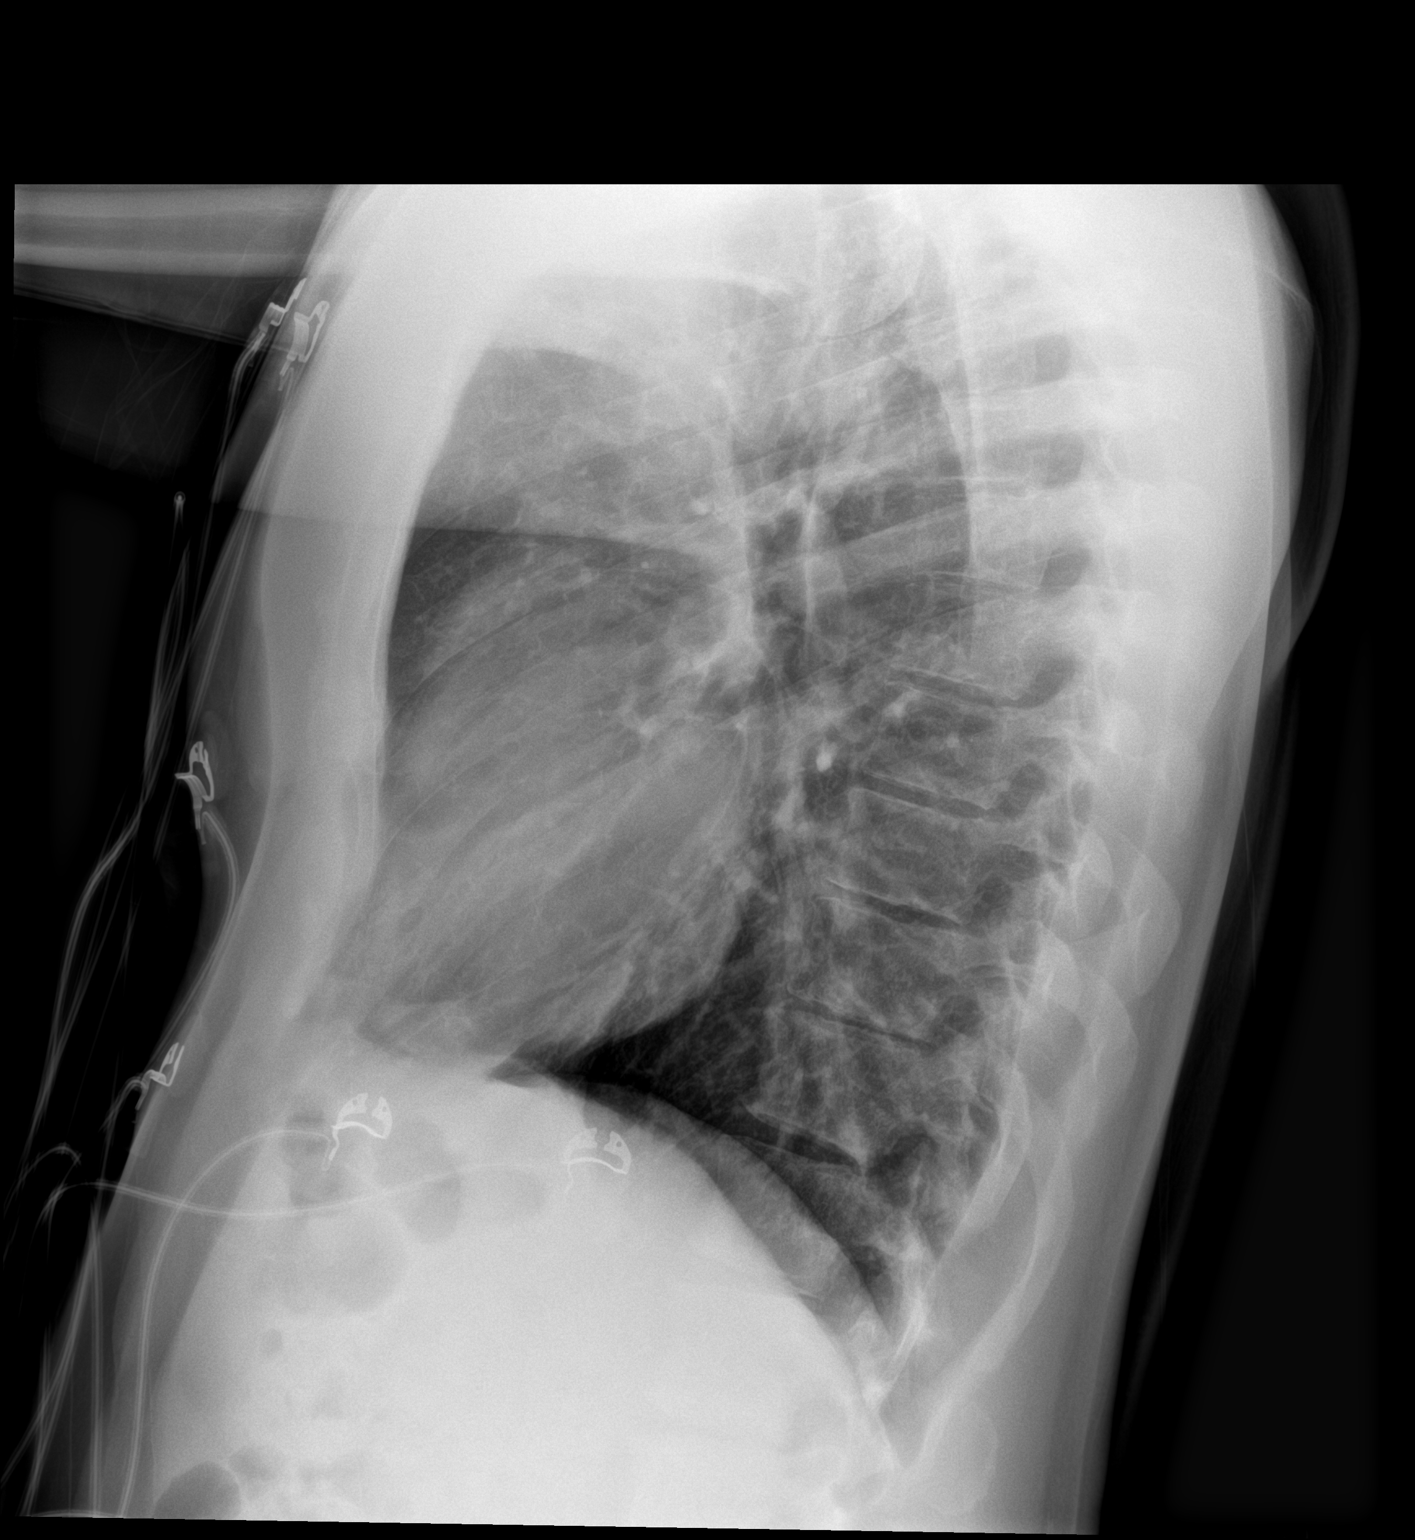

[2 of 2 positions shown; findings below may reference images not displayed]

FINDINGS: The heart size and mediastinal contours are within normal limits.
Both lungs are clear. The visualized skeletal structures are
unremarkable.
IMPRESSION: No active cardiopulmonary disease.

## 2022-11-14 ENCOUNTER — Ambulatory Visit: Payer: BC Managed Care – PPO | Admitting: Gastroenterology
